# Patient Record
Sex: Female | Born: 1945 | Race: Black or African American | Hispanic: No | Marital: Married | Smoking: Never smoker
Health system: Southern US, Community
[De-identification: ages and names within clinical notes are randomized; demographics above are authoritative.]

## PROBLEM LIST (undated history)

## (undated) DIAGNOSIS — I10 Essential (primary) hypertension: Secondary | ICD-10-CM

## (undated) HISTORY — PX: ABDOMINAL HYSTERECTOMY: SHX81

---

## 2005-05-20 ENCOUNTER — Emergency Department: Payer: Self-pay | Admitting: Emergency Medicine

## 2009-06-13 ENCOUNTER — Emergency Department: Payer: Self-pay | Admitting: Emergency Medicine

## 2010-07-11 ENCOUNTER — Ambulatory Visit: Payer: Self-pay | Admitting: Internal Medicine

## 2010-07-14 ENCOUNTER — Ambulatory Visit: Payer: Self-pay | Admitting: Internal Medicine

## 2012-03-17 ENCOUNTER — Ambulatory Visit: Payer: Self-pay | Admitting: Internal Medicine

## 2016-04-07 ENCOUNTER — Emergency Department (HOSPITAL_COMMUNITY): Payer: No Typology Code available for payment source

## 2016-04-07 ENCOUNTER — Emergency Department (HOSPITAL_COMMUNITY)
Admission: EM | Admit: 2016-04-07 | Discharge: 2016-04-08 | Disposition: A | Discharge status: Home / Self Care | Payer: No Typology Code available for payment source | Attending: Emergency Medicine | Admitting: Emergency Medicine

## 2016-04-07 ENCOUNTER — Encounter (HOSPITAL_COMMUNITY): Payer: Self-pay | Admitting: *Deleted

## 2016-04-07 DIAGNOSIS — I10 Essential (primary) hypertension: Secondary | ICD-10-CM | POA: Diagnosis not present

## 2016-04-07 DIAGNOSIS — S161XXA Strain of muscle, fascia and tendon at neck level, initial encounter: Secondary | ICD-10-CM | POA: Diagnosis not present

## 2016-04-07 DIAGNOSIS — Y939 Activity, unspecified: Secondary | ICD-10-CM | POA: Diagnosis not present

## 2016-04-07 DIAGNOSIS — Y999 Unspecified external cause status: Secondary | ICD-10-CM | POA: Insufficient documentation

## 2016-04-07 DIAGNOSIS — S199XXA Unspecified injury of neck, initial encounter: Secondary | ICD-10-CM | POA: Diagnosis present

## 2016-04-07 DIAGNOSIS — Z79899 Other long term (current) drug therapy: Secondary | ICD-10-CM | POA: Insufficient documentation

## 2016-04-07 DIAGNOSIS — Y92410 Unspecified street and highway as the place of occurrence of the external cause: Secondary | ICD-10-CM | POA: Insufficient documentation

## 2016-04-07 DIAGNOSIS — S0990XA Unspecified injury of head, initial encounter: Secondary | ICD-10-CM

## 2016-04-07 HISTORY — DX: Essential (primary) hypertension: I10

## 2016-04-07 MED ORDER — TRAMADOL HCL 50 MG PO TABS: 50.0000 mg | ORAL_TABLET | Freq: Four times a day (QID) | ORAL | 0 refills | Status: DC | PRN

## 2016-04-07 MED ORDER — IBUPROFEN 200 MG PO TABS
400.0000 mg | ORAL_TABLET | Freq: Once | ORAL | Status: AC | PRN
  Administered 2016-04-07: 400 mg via ORAL
  Filled 2016-04-07: qty 2

## 2016-04-07 NOTE — Discharge Instructions (Signed)
Use ice on the sore spots, for 2 days, after that, use heat.  Take Tylenol for pain, and only use the stronger medicine, if needed.  See your primary care doctor for better in 3 or 4 days.
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
## 2016-04-08 DIAGNOSIS — S161XXA Strain of muscle, fascia and tendon at neck level, initial encounter: Secondary | ICD-10-CM | POA: Diagnosis not present

## 2017-04-19 ENCOUNTER — Other Ambulatory Visit: Payer: Self-pay | Admitting: Medical Oncology

## 2017-04-19 DIAGNOSIS — Z1239 Encounter for other screening for malignant neoplasm of breast: Secondary | ICD-10-CM

## 2017-04-19 DIAGNOSIS — Z13820 Encounter for screening for osteoporosis: Secondary | ICD-10-CM

## 2018-09-04 ENCOUNTER — Other Ambulatory Visit: Payer: Self-pay | Admitting: Medical Oncology

## 2018-09-04 DIAGNOSIS — Z1231 Encounter for screening mammogram for malignant neoplasm of breast: Secondary | ICD-10-CM

## 2018-09-04 DIAGNOSIS — Z780 Asymptomatic menopausal state: Secondary | ICD-10-CM

## 2018-09-04 DIAGNOSIS — Z13820 Encounter for screening for osteoporosis: Secondary | ICD-10-CM

## 2020-08-11 ENCOUNTER — Encounter: Payer: Self-pay | Admitting: Emergency Medicine

## 2020-08-11 ENCOUNTER — Other Ambulatory Visit: Payer: Self-pay

## 2020-08-11 ENCOUNTER — Emergency Department: Payer: Medicare Other

## 2020-08-11 ENCOUNTER — Emergency Department
Admission: EM | Admit: 2020-08-11 | Discharge: 2020-08-11 | Disposition: A | Discharge status: Home / Self Care | Payer: Medicare Other | Attending: Emergency Medicine | Admitting: Emergency Medicine

## 2020-08-11 DIAGNOSIS — M7121 Synovial cyst of popliteal space [Baker], right knee: Secondary | ICD-10-CM | POA: Insufficient documentation

## 2020-08-11 DIAGNOSIS — I10 Essential (primary) hypertension: Secondary | ICD-10-CM | POA: Diagnosis not present

## 2020-08-11 DIAGNOSIS — Z79899 Other long term (current) drug therapy: Secondary | ICD-10-CM | POA: Insufficient documentation

## 2020-08-11 DIAGNOSIS — M25561 Pain in right knee: Secondary | ICD-10-CM | POA: Diagnosis present

## 2020-08-11 MED ORDER — PREDNISONE 50 MG PO TABS: 50.0000 mg | ORAL_TABLET | Freq: Every day | ORAL | 0 refills | Status: DC

## 2020-08-11 MED ORDER — PREDNISONE 20 MG PO TABS
60.0000 mg | ORAL_TABLET | Freq: Once | ORAL | Status: AC
  Administered 2020-08-11: 60 mg via ORAL
  Filled 2020-08-11: qty 3

## 2020-08-11 NOTE — ED Triage Notes (Signed)
Pt reports started with pain to her right knee that started 2 days ago. Pt reports pain radiated down and now radiates up so she called her MD who told her to come to the ED and r/o DVT.

## 2020-08-11 NOTE — ED Notes (Signed)
See triage note  Presents with right knee pain for 2 days  No injury  States pain is moving down leg

## 2020-08-11 NOTE — ED Provider Notes (Signed)
[facility] Emergency Department Provider Note  ____________________________________________  Time seen: Approximately 5:45 PM  I have reviewed the triage vital signs and the nursing notes.   HISTORY  Chief Complaint Knee Pain    HPI Jamie Rodgers is a 75 y.o. female who presents the emergency department complaining of pain behind her right knee.  She states that this is been ongoing for several weeks, gradually worsening.  It is worsened with weightbearing.  She states that sometimes she has pain radiating into her lower leg as well as sometimes into her upper leg.  No gross edema or erythema is reported.  Patient denies any history of DVT or PE.  No chest pain or shortness of breath.  No medications for this complaint prior to arrival.  Patient is concerned for blood clot given the location of her pain.         Past Medical History:  Diagnosis Date  . Hypertension     There are no problems to display for this patient.   Past Surgical History:  Procedure Laterality Date  . ABDOMINAL HYSTERECTOMY      Prior to Admission medications   Medication Sig Start Date End Date Taking? Authorizing Provider  amLODipine (NORVASC) 5 MG tablet Take 5 mg by mouth daily.   Yes [provider]  atorvastatin (LIPITOR) 10 MG tablet Take 10 mg by mouth daily.   Yes [provider]  predniSONE (DELTASONE) 50 MG tablet Take 1 tablet (50 mg total) by mouth daily with breakfast. 08/11/20  Yes Esli Clements, Delorise Royals, PA-C  PRESCRIPTION MEDICATION Take 1 tablet by mouth daily. Pt states she takes blood pressure medication, I named several BP meds and she said she THINKS its Amlodipine but does not know for sure.    [provider]  PRESCRIPTION MEDICATION Take 1 tablet by mouth daily. Pt states she takes a med for her cholesterol, but does not know what it is called    [provider]  traMADol (ULTRAM) 50 MG tablet Take 1 tablet (50 mg total)  by mouth every 6 (six) hours as needed. 04/07/16   Mancel Bale, MD    Allergies Penicillins  No family history on file.  Social History Social History   Tobacco Use  . Smoking status: Never Smoker  Substance Use Topics  . Alcohol use: No  . Drug use: No     Review of Systems  Constitutional: No fever/chills Eyes: No visual changes. No discharge ENT: No upper respiratory complaints. Cardiovascular: no chest pain. Respiratory: no cough. No SOB. Gastrointestinal: No abdominal pain.  No nausea, no vomiting.  No diarrhea.  No constipation. Musculoskeletal: Right lower extremity pain primarily behind the right knee Skin: Negative for rash, abrasions, lacerations, ecchymosis. Neurological: Negative for headaches, focal weakness or numbness.  10 System ROS otherwise negative.  ____________________________________________   PHYSICAL EXAM:  VITAL SIGNS: ED Triage Vitals  Enc Vitals Group     BP 08/11/20 1617 (!) 150/78     Pulse Rate 08/11/20 1617 78     Resp 08/11/20 1617 18     Temp 08/11/20 1617 98 F (36.7 C)     Temp Source 08/11/20 1617 Oral     SpO2 08/11/20 1617 100 %     Weight 08/11/20 1528 200 lb (90.7 kg)     Height 08/11/20 1528 4\' 11"  (1.499 m)     Head Circumference --      Peak Flow --      Pain  Score 08/11/20 1527 9     Pain Loc --      Pain Edu? --      Excl. in GC? --      Constitutional: Alert and oriented. Well appearing and in no acute distress. Eyes: Conjunctivae are normal. PERRL. EOMI. Head: Atraumatic. ENT:      Ears:       Nose: No congestion/rhinnorhea.      Mouth/Throat: Mucous membranes are moist.  Neck: No stridor.    Cardiovascular: Normal rate, regular rhythm. Normal S1 and S2.  Good peripheral circulation. Respiratory: Normal respiratory effort without tachypnea or retractions. Lungs CTAB. Good air entry to the bases with no decreased or absent breath sounds. Musculoskeletal: Full range of motion to all extremities. No  gross deformities appreciated.  Visualization of the right lower leg reveals no gross erythema or edema.  No deformity.  Full range of motion to the hip, knee and ankle joint.  Patient is tender to palpation in the popliteal fossa with no other tenderness.  Dorsalis pedis pulses sensation intact distally. Neurologic:  Normal speech and language. No gross focal neurologic deficits are appreciated.  Skin:  Skin is warm, dry and intact. No rash noted. Psychiatric: Mood and affect are normal. Speech and behavior are normal. Patient exhibits appropriate insight and judgement.   ____________________________________________   LABS (all labs ordered are listed, but only abnormal results are displayed)  Labs Reviewed - No data to display ____________________________________________  EKG   ____________________________________________  RADIOLOGY I personally viewed and evaluated these images as part of my medical decision making, as well as reviewing the written report by the radiologist.  ED Provider Interpretation: Evidence of Baker's cyst with no evidence of DVT  US Venous Img Lower Unilateral Right  Result Date: 08/11/2020 CLINICAL DATA:  Right lower extremity pain and edema EXAM: Right LOWER EXTREMITY VENOUS DOPPLER ULTRASOUND TECHNIQUE: Gray-scale sonography with compression, as well as color and duplex ultrasound, were performed to evaluate the deep venous system(s) from the level of the common femoral vein through the popliteal and proximal calf veins. COMPARISON:  None. FINDINGS: VENOUS Normal compressibility of the common femoral, superficial femoral, and popliteal veins, as well as the visualized calf veins. Visualized portions of profunda femoral vein and great saphenous vein unremarkable. No filling defects to suggest DVT on grayscale or color Doppler imaging. Doppler waveforms show normal direction of venous flow, normal respiratory plasticity and response to augmentation. Limited views  of the contralateral common femoral vein are unremarkable. OTHER Small popliteal fossa cyst measuring 3.5 x 1.3 x 2 cm Limitations: none IMPRESSION: Negative for acute right lower extremity DVT Electronically Signed   By: Jasmine Pang M.D.   On: 08/11/2020 17:08    ____________________________________________    PROCEDURES  Procedure(s) performed:    Procedures    Medications  predniSONE (DELTASONE) tablet 60 mg (has no administration in time range)     ____________________________________________   INITIAL IMPRESSION / ASSESSMENT AND PLAN / ED COURSE  Pertinent labs & imaging results that were available during my care of the patient were reviewed by me and considered in my medical decision making (see chart for details).  Review of the [place] CSRS was performed in accordance of the [facility] prior to dispensing any controlled drugs.           Patient's diagnosis is consistent with Baker's cyst.  Patient presents emergency department with pain behind the right knee.  Differential included DVT, cellulitis, arthritis, Baker's cyst.  Ultrasound revealed  no evidence of DVT.  Findings consistent with Baker's cyst on ultrasound.  Patient was given prednisone for anti-inflammatory control.  If symptoms do not improve follow-up with orthopedics.  No further work-up at this time.  Patient stable for discharge.  Can follow-up with orthopedics if symptoms do not improve with conservative measures.   Patient is given ED precautions to return to the ED for any worsening or new symptoms.     ____________________________________________  FINAL CLINICAL IMPRESSION(S) / ED DIAGNOSES  Final diagnoses:  Baker cyst, right      NEW MEDICATIONS STARTED DURING THIS VISIT:  ED Discharge Orders         Ordered    predniSONE (DELTASONE) 50 MG tablet  Daily with breakfast        08/11/20 1748              This chart was dictated using voice recognition software/Dragon. Despite best  efforts to proofread, errors can occur which can change the meaning. Any change was purely unintentional.    Racheal Patches, PA-C 08/11/20 1750    Shaune Pollack, MD 08/16/20 [phone]

## 2020-10-18 ENCOUNTER — Other Ambulatory Visit: Payer: Self-pay | Admitting: Orthopedic Surgery

## 2020-10-18 DIAGNOSIS — M2391 Unspecified internal derangement of right knee: Secondary | ICD-10-CM

## 2020-10-18 DIAGNOSIS — M1711 Unilateral primary osteoarthritis, right knee: Secondary | ICD-10-CM

## 2020-10-28 ENCOUNTER — Other Ambulatory Visit: Payer: Self-pay

## 2020-10-28 ENCOUNTER — Ambulatory Visit
Admission: RE | Admit: 2020-10-28 | Discharge: 2020-10-28 | Disposition: A | Discharge status: Home / Self Care | Payer: Medicare Other | Source: Ambulatory Visit | Attending: Orthopedic Surgery | Admitting: Orthopedic Surgery

## 2020-10-28 DIAGNOSIS — M1711 Unilateral primary osteoarthritis, right knee: Secondary | ICD-10-CM | POA: Diagnosis present

## 2020-10-28 DIAGNOSIS — M2391 Unspecified internal derangement of right knee: Secondary | ICD-10-CM | POA: Diagnosis present

## 2020-12-16 ENCOUNTER — Telehealth: Payer: Self-pay

## 2020-12-16 NOTE — Telephone Encounter (Signed)
Placed call to reschedule visit. Pt was unable to talk. Pt will cal office back.

## 2020-12-27 ENCOUNTER — Ambulatory Visit: Payer: Medicare Other | Admitting: Adult Health

## 2021-01-26 ENCOUNTER — Other Ambulatory Visit: Payer: Self-pay

## 2021-01-26 ENCOUNTER — Other Ambulatory Visit (HOSPITAL_COMMUNITY): Payer: Self-pay

## 2021-01-26 MED ORDER — OSELTAMIVIR PHOSPHATE 75 MG PO CAPS
ORAL_CAPSULE | ORAL | 0 refills | Status: DC
  Filled 2021-01-26: qty 10, 5d supply, fill #0

## 2021-01-30 ENCOUNTER — Other Ambulatory Visit: Payer: Self-pay

## 2021-01-30 ENCOUNTER — Ambulatory Visit
Admission: RE | Admit: 2021-01-30 | Discharge: 2021-01-30 | Disposition: A | Discharge status: Home / Self Care | Payer: Medicare Other | Attending: Family Medicine | Admitting: Family Medicine

## 2021-01-30 ENCOUNTER — Ambulatory Visit
Admission: RE | Admit: 2021-01-30 | Discharge: 2021-01-30 | Disposition: A | Discharge status: Home / Self Care | Payer: Medicare Other | Source: Ambulatory Visit | Attending: Family Medicine | Admitting: Family Medicine

## 2021-01-30 ENCOUNTER — Other Ambulatory Visit: Payer: Self-pay | Admitting: Family Medicine

## 2021-01-30 DIAGNOSIS — R051 Acute cough: Secondary | ICD-10-CM | POA: Insufficient documentation

## 2021-02-01 ENCOUNTER — Emergency Department
Admission: EM | Admit: 2021-02-01 | Discharge: 2021-02-01 | Disposition: A | Discharge status: Home / Self Care | Payer: Medicare Other | Attending: Emergency Medicine | Admitting: Emergency Medicine

## 2021-02-01 ENCOUNTER — Other Ambulatory Visit: Payer: Self-pay

## 2021-02-01 ENCOUNTER — Emergency Department: Payer: Medicare Other

## 2021-02-01 DIAGNOSIS — I10 Essential (primary) hypertension: Secondary | ICD-10-CM | POA: Insufficient documentation

## 2021-02-01 DIAGNOSIS — R531 Weakness: Secondary | ICD-10-CM | POA: Diagnosis present

## 2021-02-01 DIAGNOSIS — Z79899 Other long term (current) drug therapy: Secondary | ICD-10-CM | POA: Insufficient documentation

## 2021-02-01 DIAGNOSIS — J101 Influenza due to other identified influenza virus with other respiratory manifestations: Secondary | ICD-10-CM | POA: Diagnosis not present

## 2021-02-01 DIAGNOSIS — J111 Influenza due to unidentified influenza virus with other respiratory manifestations: Secondary | ICD-10-CM

## 2021-02-01 LAB — CBC WITH DIFFERENTIAL/PLATELET
Abs Immature Granulocytes: 0.06 10*3/uL (ref 0.00–0.07)
Basophils Absolute: 0.1 10*3/uL (ref 0.0–0.1)
Basophils Relative: 1 %
Eosinophils Absolute: 0.2 10*3/uL (ref 0.0–0.5)
Eosinophils Relative: 2 %
HCT: 43.1 % (ref 36.0–46.0)
Hemoglobin: 13.8 g/dL (ref 12.0–15.0)
Immature Granulocytes: 1 %
Lymphocytes Relative: 19 %
Lymphs Abs: 1.8 10*3/uL (ref 0.7–4.0)
MCH: 29.5 pg (ref 26.0–34.0)
MCHC: 32 g/dL (ref 30.0–36.0)
MCV: 92.1 fL (ref 80.0–100.0)
Monocytes Absolute: 1.2 10*3/uL — ABNORMAL HIGH (ref 0.1–1.0)
Monocytes Relative: 13 %
Neutro Abs: 6.2 10*3/uL (ref 1.7–7.7)
Neutrophils Relative %: 64 %
Platelets: 279 10*3/uL (ref 150–400)
RBC: 4.68 MIL/uL (ref 3.87–5.11)
RDW: 13.3 % (ref 11.5–15.5)
WBC: 9.4 10*3/uL (ref 4.0–10.5)
nRBC: 0 % (ref 0.0–0.2)

## 2021-02-01 LAB — COMPREHENSIVE METABOLIC PANEL
ALT: 16 U/L (ref 0–44)
AST: 22 U/L (ref 15–41)
Albumin: 3.8 g/dL (ref 3.5–5.0)
Alkaline Phosphatase: 87 U/L (ref 38–126)
Anion gap: 9 (ref 5–15)
BUN: 7 mg/dL — ABNORMAL LOW (ref 8–23)
CO2: 26 mmol/L (ref 22–32)
Calcium: 9.2 mg/dL (ref 8.9–10.3)
Chloride: 103 mmol/L (ref 98–111)
Creatinine, Ser: 0.86 mg/dL (ref 0.44–1.00)
GFR, Estimated: >60 mL/min (ref >60)
Glucose, Bld: 104 mg/dL — ABNORMAL HIGH (ref 70–99)
Potassium: 3.6 mmol/L (ref 3.5–5.1)
Sodium: 138 mmol/L (ref 135–145)
Total Bilirubin: 1 mg/dL (ref 0.3–1.2)
Total Protein: 7.3 g/dL (ref 6.5–8.1)

## 2021-02-01 LAB — TROPONIN I (HIGH SENSITIVITY): Troponin I (High Sensitivity): 5 ng/L (ref <18)

## 2021-02-01 LAB — URINALYSIS, COMPLETE (UACMP) WITH MICROSCOPIC
Bacteria, UA: NONE SEEN
Bilirubin Urine: NEGATIVE
Glucose, UA: NEGATIVE mg/dL
Hgb urine dipstick: NEGATIVE
Ketones, ur: NEGATIVE mg/dL
Nitrite: NEGATIVE
Protein, ur: NEGATIVE mg/dL
Specific Gravity, Urine: 1.001 — ABNORMAL LOW (ref 1.005–1.030)
pH: 6 (ref 5.0–8.0)

## 2021-02-01 MED ORDER — SODIUM CHLORIDE 0.9 % IV BOLUS
500.0000 mL | Freq: Once | INTRAVENOUS | Status: AC
  Administered 2021-02-01: 17:00:00 500 mL via INTRAVENOUS

## 2021-02-01 NOTE — ED Notes (Signed)
2nd troponin not needed, discontinued per [place], pa

## 2021-02-01 NOTE — ED Triage Notes (Signed)
Pt to ED for fatigue since being dx with flu on Monday. NAD noted

## 2021-02-01 NOTE — ED Provider Notes (Signed)
[facility]-EMERGENCY DEPARTMENT  ____________________________________________  Time seen: Approximately 3:42 PM  I have reviewed the triage vital signs and the nursing notes.   HISTORY  Chief Complaint Influenza   Historian Patient     HPI Jamie Rodgers is a 75 y.o. female presents to the emergency department with generalized weakness that started on Monday.  Patient states that she feels like she is going to pass out.  She was diagnosed with influenza A on Monday.  She states that she has had 1-2 episodes of vomiting since her symptoms started but no diarrhea.  Husband states that patient is still coughing.  Denies chest tightness, shortness of breath or abdominal pain.  Patient had chest x-ray yesterday with reassuring results.   Past Medical History:  Diagnosis Date   Hypertension      Immunizations up to date:  Yes.     Past Medical History:  Diagnosis Date   Hypertension     There are no problems to display for this patient.   Past Surgical History:  Procedure Laterality Date   ABDOMINAL HYSTERECTOMY      Prior to Admission medications   Medication Sig Start Date End Date Taking? Authorizing Provider  amLODipine (NORVASC) 5 MG tablet Take 5 mg by mouth daily.    [provider]  atorvastatin (LIPITOR) 10 MG tablet Take 10 mg by mouth daily.    [provider]  oseltamivir (TAMIFLU) 75 MG capsule Take one capsule by mouth twice a day for 5 days 01/25/21     predniSONE (DELTASONE) 50 MG tablet Take 1 tablet (50 mg total) by mouth daily with breakfast. 08/11/20   Cuthriell, Delorise Royals, PA-C  PRESCRIPTION MEDICATION Take 1 tablet by mouth daily. Pt states she takes blood pressure medication, I named several BP meds and she said she THINKS its Amlodipine but does not know for sure.    [provider]  PRESCRIPTION MEDICATION Take 1 tablet by mouth daily. Pt states she takes a med for her cholesterol, but does not know what it is called     [provider]  traMADol (ULTRAM) 50 MG tablet Take 1 tablet (50 mg total) by mouth every 6 (six) hours as needed. 04/07/16   Mancel Bale, MD    Allergies Penicillins  No family history on file.  Social History Social History   Tobacco Use   Smoking status: Never  Substance Use Topics   Alcohol use: No   Drug use: No     Review of Systems  Constitutional: Patient has chills.  Eyes:  No discharge ENT: No upper respiratory complaints. Respiratory: no cough. No SOB/ use of accessory muscles to breath Gastrointestinal:   No nausea, no vomiting.  No diarrhea.  No constipation. Musculoskeletal: Negative for musculoskeletal pain. Skin: Negative for rash, abrasions, lacerations, ecchymosis.    ____________________________________________   PHYSICAL EXAM:  VITAL SIGNS: ED Triage Vitals [02/01/21 1406]  Enc Vitals Group     BP (!) 151/74     Pulse Rate 84     Resp 20     Temp 98.9 F (37.2 C)     Temp Source Oral     SpO2 99 %     Weight 140 lb (63.5 kg)     Height 4\' 11"  (1.499 m)     Head Circumference      Peak Flow      Pain Score 0     Pain Loc      Pain Edu?  Excl. in GC?      Constitutional: Alert and oriented. Patient is lying supine. Eyes: Conjunctivae are normal. PERRL. EOMI. Head: Atraumatic. ENT:      Ears: Tympanic membranes are mildly injected with mild effusion bilaterally.       Nose: No congestion/rhinnorhea.      Mouth/Throat: Mucous membranes are moist. Posterior pharynx is mildly erythematous.  Hematological/Lymphatic/Immunilogical: No cervical lymphadenopathy.  Cardiovascular: Normal rate, regular rhythm. Normal S1 and S2.  Good peripheral circulation. Respiratory: Normal respiratory effort without tachypnea or retractions. Lungs CTAB. Good air entry to the bases with no decreased or absent breath sounds. Gastrointestinal: Bowel sounds 4 quadrants. Soft and nontender to palpation. No guarding or rigidity. No palpable  masses. No distention. No CVA tenderness. Musculoskeletal: Full range of motion to all extremities. No gross deformities appreciated. Neurologic:  Normal speech and language. No gross focal neurologic deficits are appreciated.  Skin:  Skin is warm, dry and intact. No rash noted. Psychiatric: Mood and affect are normal. Speech and behavior are normal. Patient exhibits appropriate insight and judgement.  ____________________________________________   LABS (all labs ordered are listed, but only abnormal results are displayed)  Labs Reviewed  CBC WITH DIFFERENTIAL/PLATELET - Abnormal; Notable for the following components:      Result Value   Monocytes Absolute 1.2 (*)    All other components within normal limits  COMPREHENSIVE METABOLIC PANEL - Abnormal; Notable for the following components:   Glucose, Bld 104 (*)    BUN 7 (*)    All other components within normal limits  URINALYSIS, COMPLETE (UACMP) WITH MICROSCOPIC - Abnormal; Notable for the following components:   Color, Urine STRAW (*)    APPearance CLEAR (*)    Specific Gravity, Urine 1.001 (*)    Leukocytes,Ua TRACE (*)    All other components within normal limits  TROPONIN I (HIGH SENSITIVITY)   ____________________________________________  EKG   ____________________________________________  RADIOLOGY   No results found.  ____________________________________________    PROCEDURES  Procedure(s) performed:     Procedures     Medications  sodium chloride 0.9 % bolus 500 mL (0 mLs Intravenous Stopped 02/01/21 1756)     ____________________________________________   INITIAL IMPRESSION / ASSESSMENT AND PLAN / ED COURSE  Pertinent labs & imaging results that were available during my care of the patient were reviewed by me and considered in my medical decision making (see chart for details).  Clinical Course as of 02/01/21 2205  Wed Feb 01, 2021  1721 ALT: 16 [JW]    Clinical Course User Index [JW]  Orvil Feil, [place]     Assessment and Plan:  Fatigue  Malaise:  Influenza A  75 year old female presents to the emergency department with persistent fatigue since being diagnosed with influenza A on Monday.  CBC, CMP, troponin and urinalysis within reference range.  Patient was given 500 cc of normal saline in the emergency department.  Recommended continued rest and hydration at home.  Return precautions were given to return with new or worsening symptoms.    ____________________________________________  FINAL CLINICAL IMPRESSION(S) / ED DIAGNOSES  Final diagnoses:  Influenza      NEW MEDICATIONS STARTED DURING THIS VISIT:  ED Discharge Orders     None           This chart was dictated using voice recognition software/Dragon. Despite best efforts to proofread, errors can occur which can change the meaning. Any change was purely unintentional.     Orvil Feil, PA-C 02/01/21  2205    Chesley Noon, MD 02/04/21 1004

## 2021-02-01 NOTE — ED Provider Notes (Signed)
Emergency Medicine Provider Triage Evaluation Note  Jamie Rodgers , a 75 y.o. female  was evaluated in triage.  Pt complains of generalized body aches. Diagnosed with influenza on Monday. Symptoms not improving.  Review of Systems  Positive: Body aches Negative: Fever  Physical Exam  BP (!) 151/74   Pulse 84   Temp 98.9 F (37.2 C) (Oral)   Resp 20   SpO2 99%  Gen:   Awake, no distress   Resp:  Normal effort  MSK:   Moves extremities without difficulty  Other:    Medical Decision Making  Medically screening exam initiated at 2:07 PM.  Appropriate orders placed.  Jamie Rodgers was informed that the remainder of the evaluation will be completed by another provider, this initial triage assessment does not replace that evaluation, and the importance of remaining in the ED until their evaluation is complete.   Chinita Pester, FNP 02/01/21 1408    Chesley Noon, MD 02/01/21 1910

## 2022-05-10 ENCOUNTER — Other Ambulatory Visit: Payer: Self-pay | Admitting: Internal Medicine

## 2022-05-10 DIAGNOSIS — Z1231 Encounter for screening mammogram for malignant neoplasm of breast: Secondary | ICD-10-CM

## 2022-05-10 DIAGNOSIS — Z13820 Encounter for screening for osteoporosis: Secondary | ICD-10-CM

## 2022-06-28 ENCOUNTER — Other Ambulatory Visit: Payer: Medicare Other

## 2022-10-03 ENCOUNTER — Other Ambulatory Visit: Payer: Self-pay

## 2022-10-03 ENCOUNTER — Encounter: Payer: Self-pay | Admitting: Emergency Medicine

## 2022-10-03 ENCOUNTER — Emergency Department
Admission: EM | Admit: 2022-10-03 | Discharge: 2022-10-03 | Disposition: A | Discharge status: Home / Self Care | Payer: Medicare Other | Attending: Emergency Medicine | Admitting: Emergency Medicine

## 2022-10-03 ENCOUNTER — Emergency Department: Payer: Medicare Other

## 2022-10-03 DIAGNOSIS — M542 Cervicalgia: Secondary | ICD-10-CM | POA: Diagnosis not present

## 2022-10-03 DIAGNOSIS — M4802 Spinal stenosis, cervical region: Secondary | ICD-10-CM | POA: Insufficient documentation

## 2022-10-03 DIAGNOSIS — S0990XA Unspecified injury of head, initial encounter: Secondary | ICD-10-CM | POA: Insufficient documentation

## 2022-10-03 DIAGNOSIS — Y92410 Unspecified street and highway as the place of occurrence of the external cause: Secondary | ICD-10-CM | POA: Insufficient documentation

## 2022-10-03 MED ORDER — HYDROCODONE-ACETAMINOPHEN 5-325 MG PO TABS
1.0000 | ORAL_TABLET | Freq: Once | ORAL | Status: AC
  Administered 2022-10-03: 1 via ORAL
  Filled 2022-10-03: qty 1

## 2022-10-03 MED ORDER — HYDROCODONE-ACETAMINOPHEN 5-325 MG PO TABS: 1.0000 | ORAL_TABLET | ORAL | 0 refills | Status: AC | PRN

## 2022-10-03 MED ORDER — ONDANSETRON 4 MG PO TBDP
4.0000 mg | ORAL_TABLET | Freq: Once | ORAL | Status: AC
  Administered 2022-10-03: 4 mg via ORAL
  Filled 2022-10-03: qty 1
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
## 2022-10-03 NOTE — Discharge Instructions (Signed)
Take Norco as needed for pain. 
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
## 2023-03-07 IMAGING — MR MR KNEE*R* W/O CM
6 series · 40 of 40 positions shown · non-contrast
Comparison: None.

CLINICAL DATA: Right knee pain for 4 months. No known injury.

EXAM:
MRI OF THE RIGHT KNEE WITHOUT CONTRAST
TECHNIQUE: Multiplanar, multisequence MR imaging of the knee was performed. No
intravenous contrast was administered.

[Series 8: T2 fat-sat · axial · right · 4.0mm · 0.50mm/px · z∈[-55,+69]mm · 7 of 26 slices shown (1 of 3)]
[im 1/26]
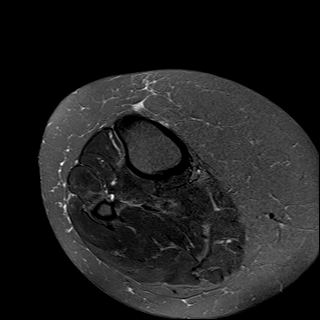
[im 5/26]
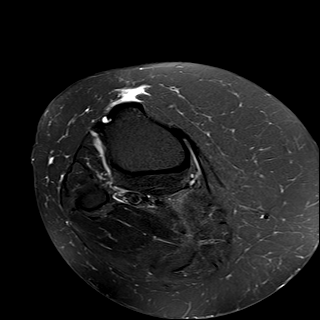
[im 9/26]
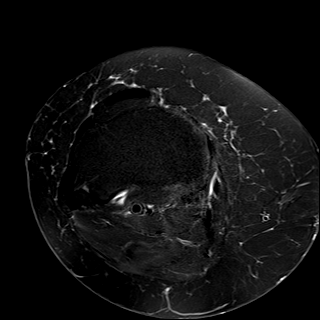
[im 13/26]
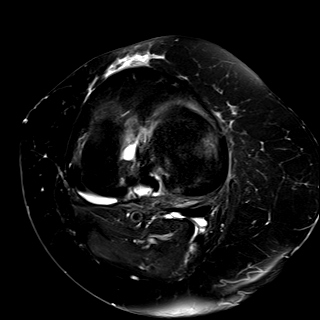
[im 17/26]
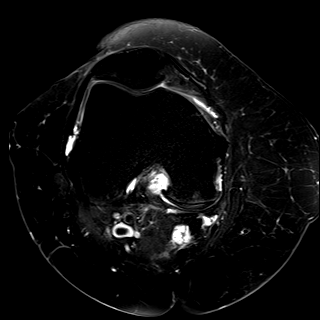
[im 21/26]
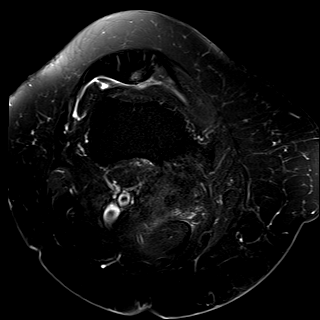
[im 26/26]
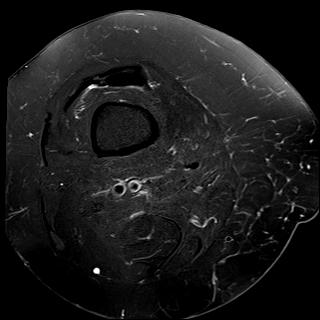

[Series 9: T2 fat-sat · coronal · right · 4.0mm · 0.59mm/px · 6 of 30 slices shown (2 of 3)]
[im 1/30]
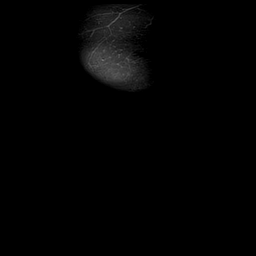
[im 6/30]
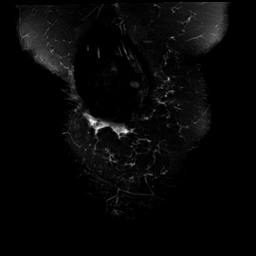
[im 12/30]
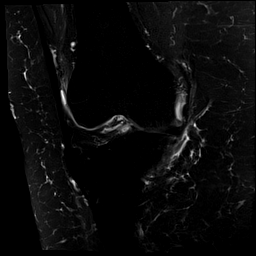
[im 18/30]
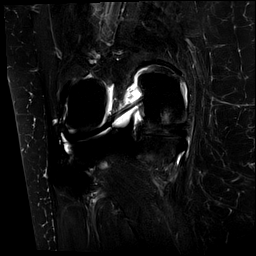
[im 24/30]
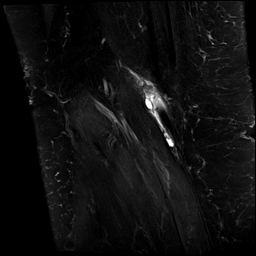
[im 30/30]
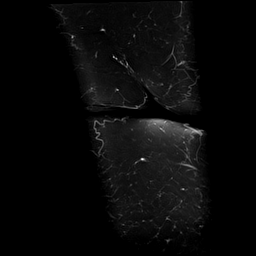

[Series 10: T1 · coronal · right · 4.0mm · 0.59mm/px · 6 of 30 slices shown]
[im 1/30]
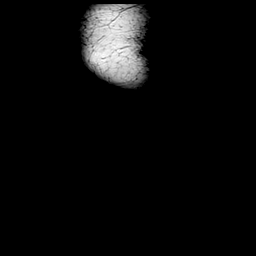
[im 6/30]
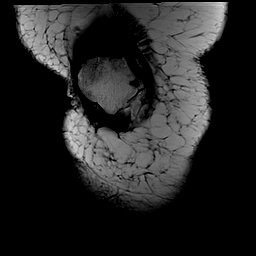
[im 12/30]
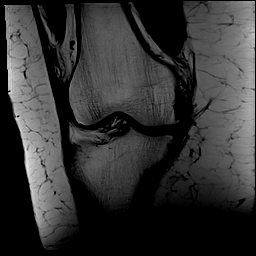
[im 18/30]
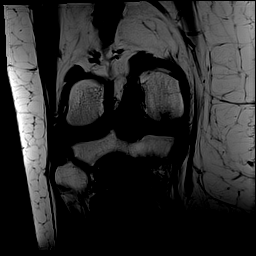
[im 24/30]
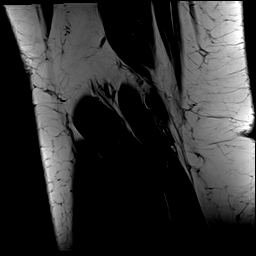
[im 30/30]
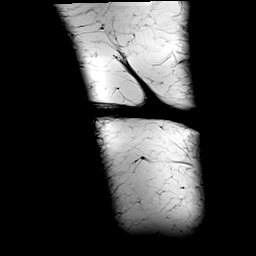

[Series 11: PD fat-sat · coronal · right · 4.0mm · 0.59mm/px · 6 of 30 slices shown (1 of 2)]
[im 1/30]
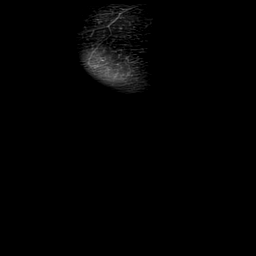
[im 6/30]
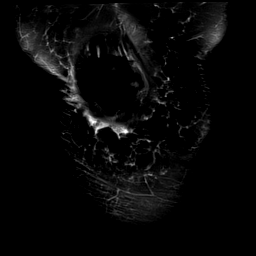
[im 12/30]
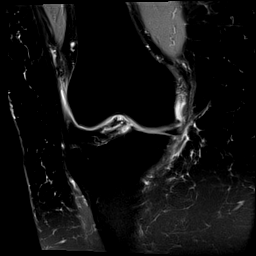
[im 18/30]
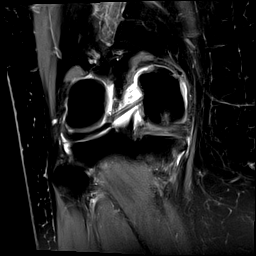
[im 24/30]
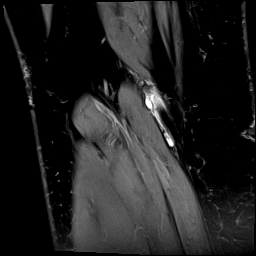
[im 30/30]
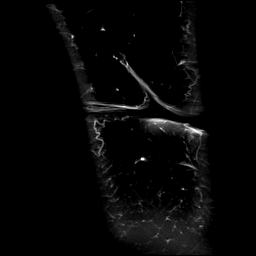

[Series 12: PD fat-sat · sagittal · right · 3.0mm · 0.59mm/px · 7 of 35 slices shown (2 of 2)]
[im 1/35]
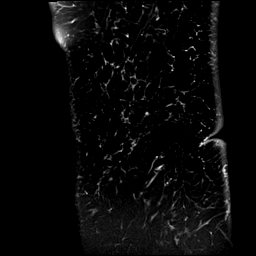
[im 6/35]
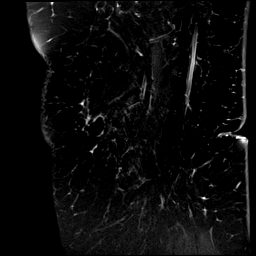
[im 12/35]
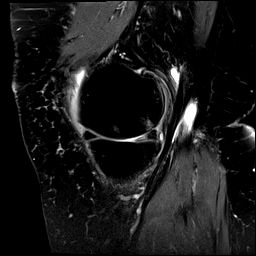
[im 18/35]
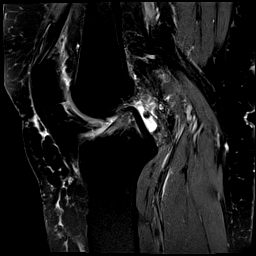
[im 23/35]
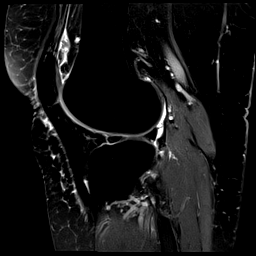
[im 29/35]
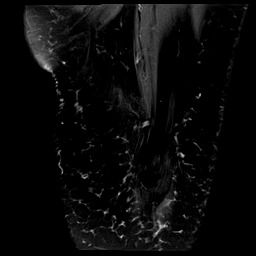
[im 35/35]
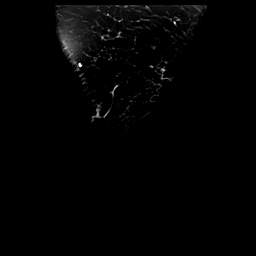

[Series 13: T2 fat-sat · sagittal · right · 3.0mm · 0.59mm/px · 8 of 36 slices shown (3 of 3)]
[im 1/36]
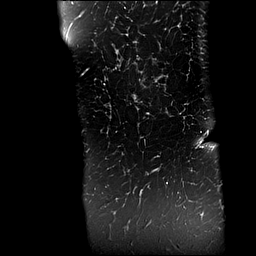
[im 6/36]
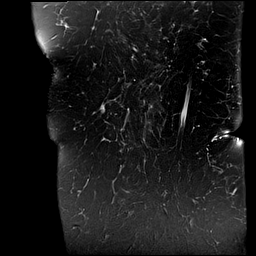
[im 11/36]
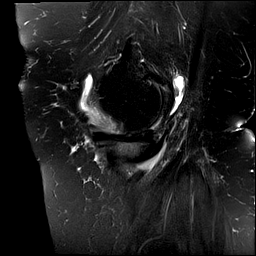
[im 16/36]
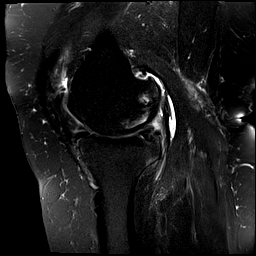
[im 21/36]
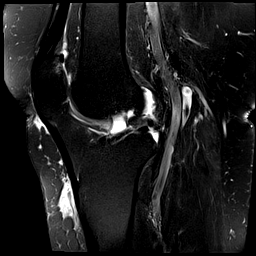
[im 26/36]
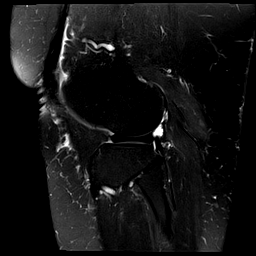
[im 31/36]
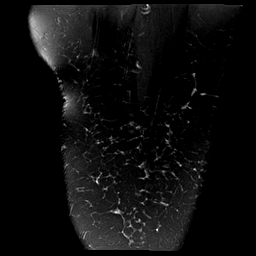
[im 36/36]
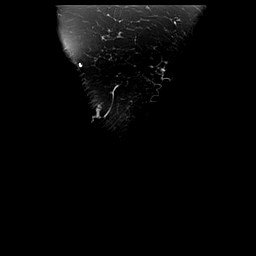

[40 of 40 positions shown; findings below may reference images not displayed]

FINDINGS: MENISCI

Medial: Large complex tear of the posterior horn of the medial
meniscus with a radial component and peripheral meniscal extrusion.
Degeneration of the body of the medial meniscus.

Lateral: Intact.

LIGAMENTS

Cruciates: ACL and PCL are intact.

Collaterals: Medial collateral ligament is intact. Lateral
collateral ligament complex is intact.

CARTILAGE

Patellofemoral: Focal full-thickness cartilage loss of the superior
aspect of the patellar apex with subchondral reactive marrow
changes. Partial-thickness cartilage loss of the medial patellar
facet.

Medial: High-grade partial-thickness cartilage loss with areas of
full-thickness cartilage loss of the medial femorotibial compartment
subchondral reactive marrow edema.

Lateral:  No chondral defect.

JOINT: No joint effusion. Normal Leyla Reiff. No plical
thickening.

POPLITEAL FOSSA: Popliteus tendon is intact. Small Baker's cyst.

EXTENSOR MECHANISM: Intact quadriceps tendon. Intact patellar
tendon. Intact lateral patellar retinaculum. Intact medial patellar
retinaculum. Intact MPFL.

BONES: No aggressive osseous lesion. No fracture or dislocation.

Other: No fluid collection or hematoma. Muscles are normal.
IMPRESSION: 1. Large complex tear of the posterior horn of the medial meniscus
with a radial component and peripheral meniscal extrusion.
Degeneration of the body of the medial meniscus.
2. Focal full-thickness cartilage loss of the superior aspect of the
patellar apex with subchondral reactive marrow changes.
Partial-thickness cartilage loss of the medial patellar facet.
3. High-grade partial-thickness cartilage loss with areas of
full-thickness cartilage loss of the medial femorotibial compartment
subchondral reactive marrow edema.

## 2023-06-09 IMAGING — CR DG CHEST 2V
2 series · 2 of 2 positions shown · non-contrast
Comparison: Prior chest radiographs 04/07/2016 and earlier.

CLINICAL DATA: Acute cough. Additional history provided: Acute
cough for several days.

EXAM:
CHEST - 2 VIEW

[chest pa]
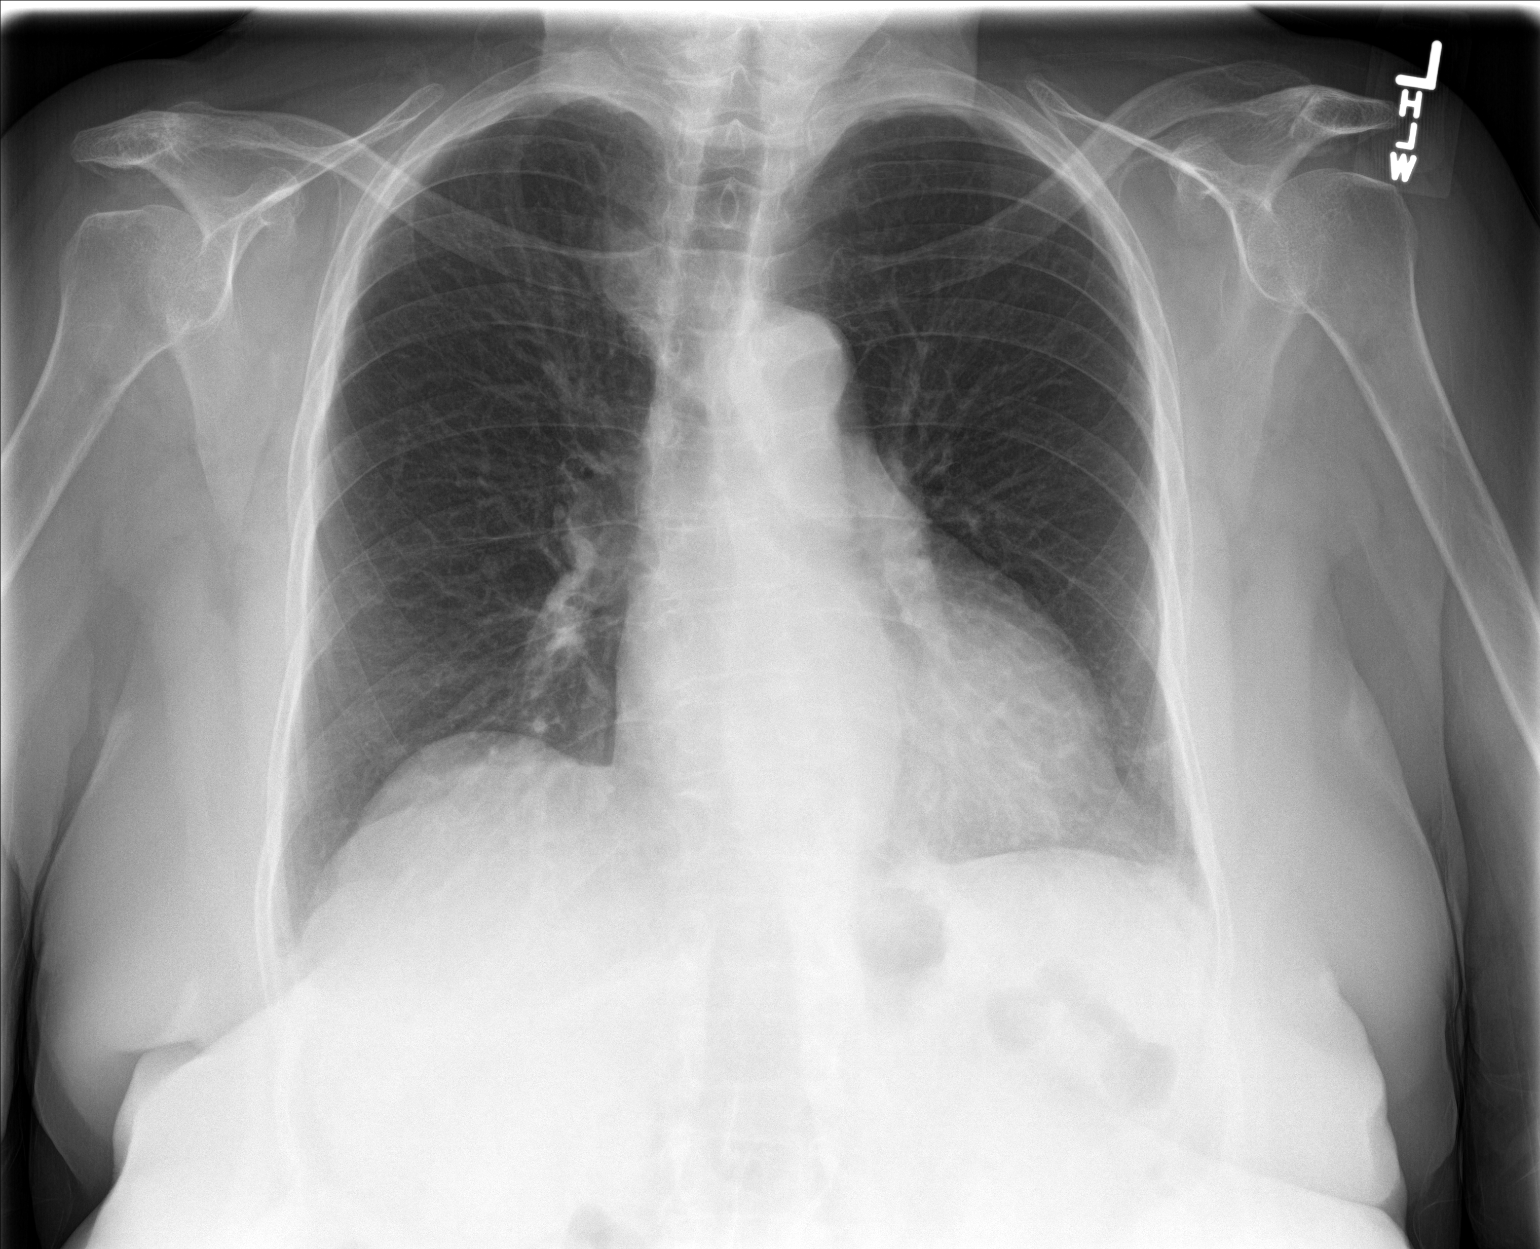

[chest lat]
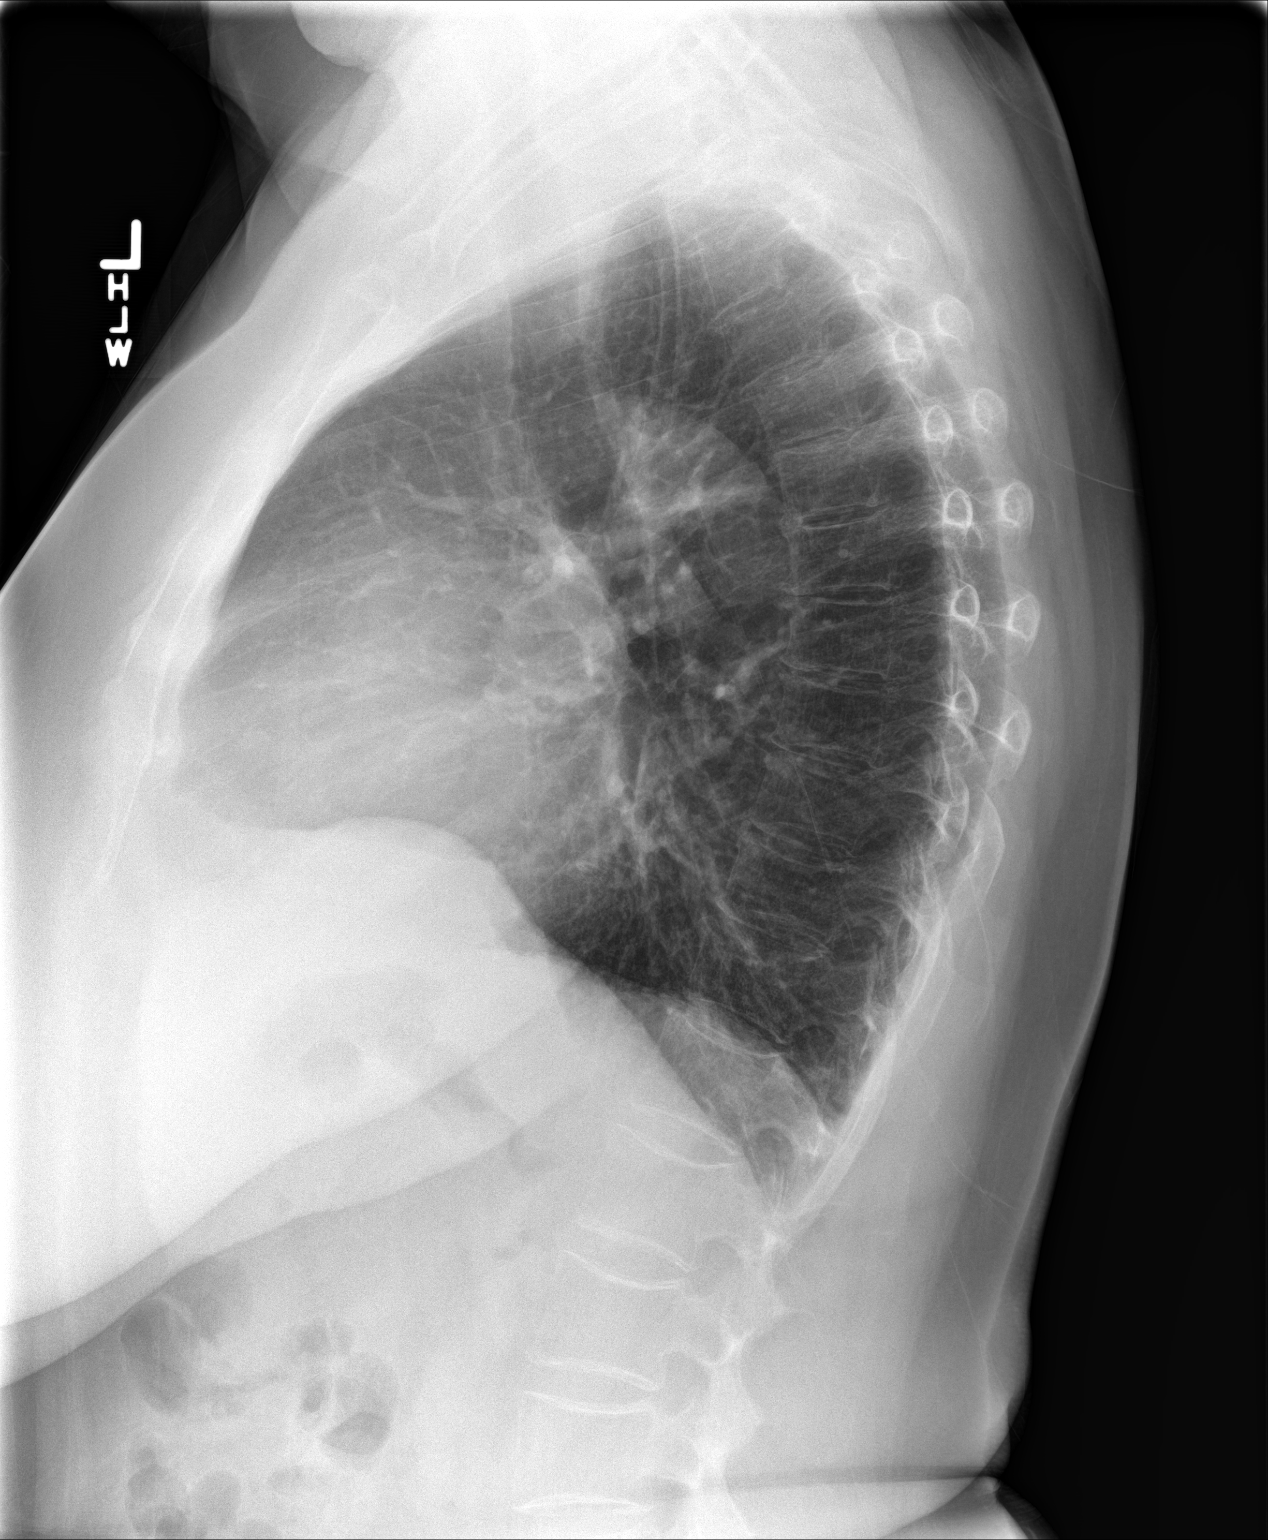

[2 of 2 positions shown; findings below may reference images not displayed]

FINDINGS: Heart size within normal limits. Mild linear atelectasis versus
scarring within the left lung base. No appreciable airspace
consolidation. No evidence of pleural effusion or pneumothorax. No
acute bony abnormality identified. Thoracic dextrocurvature.
IMPRESSION: Mild linear atelectasis versus scarring within the left lung base.

No appreciable airspace consolidation.

Thoracic dextrocurvature.
